# Patient Record
Sex: Male | Born: 1970 | Race: White | Hispanic: No | Marital: Married
Health system: Southern US, Community
[De-identification: ages and names within clinical notes are randomized; demographics above are authoritative.]

---

## 2004-09-23 ENCOUNTER — Emergency Department (HOSPITAL_COMMUNITY): Admission: EM | Admit: 2004-09-23 | Discharge: 2004-09-23 | Payer: Self-pay | Admitting: Family Medicine

## 2004-12-10 ENCOUNTER — Emergency Department (HOSPITAL_COMMUNITY): Admission: EM | Admit: 2004-12-10 | Discharge: 2004-12-10 | Payer: Self-pay | Admitting: Family Medicine

## 2005-04-08 ENCOUNTER — Emergency Department (HOSPITAL_COMMUNITY): Admission: EM | Admit: 2005-04-08 | Discharge: 2005-04-08 | Payer: Self-pay | Admitting: Emergency Medicine

## 2010-01-18 ENCOUNTER — Emergency Department (HOSPITAL_COMMUNITY): Admission: EM | Admit: 2010-01-18 | Discharge: 2010-01-18 | Payer: Self-pay | Admitting: Family Medicine

## 2014-02-22 ENCOUNTER — Ambulatory Visit
Admission: RE | Admit: 2014-02-22 | Discharge: 2014-02-22 | Disposition: A | Discharge status: Home / Self Care | Payer: BC Managed Care – PPO | Source: Ambulatory Visit | Attending: Family Medicine | Admitting: Family Medicine

## 2014-02-22 ENCOUNTER — Other Ambulatory Visit: Payer: Self-pay | Admitting: Family Medicine

## 2014-02-22 DIAGNOSIS — M545 Low back pain, unspecified: Secondary | ICD-10-CM

## 2015-07-23 IMAGING — CR DG SACRUM/COCCYX 2+V
3 series · 3 of 3 positions shown · non-contrast
Comparison: None.

CLINICAL DATA: Fell on ice with low back and coccygeal pain

EXAM:
SACRUM AND COCCYX - 2+ VIEW

[view not recorded (1 of 3)]
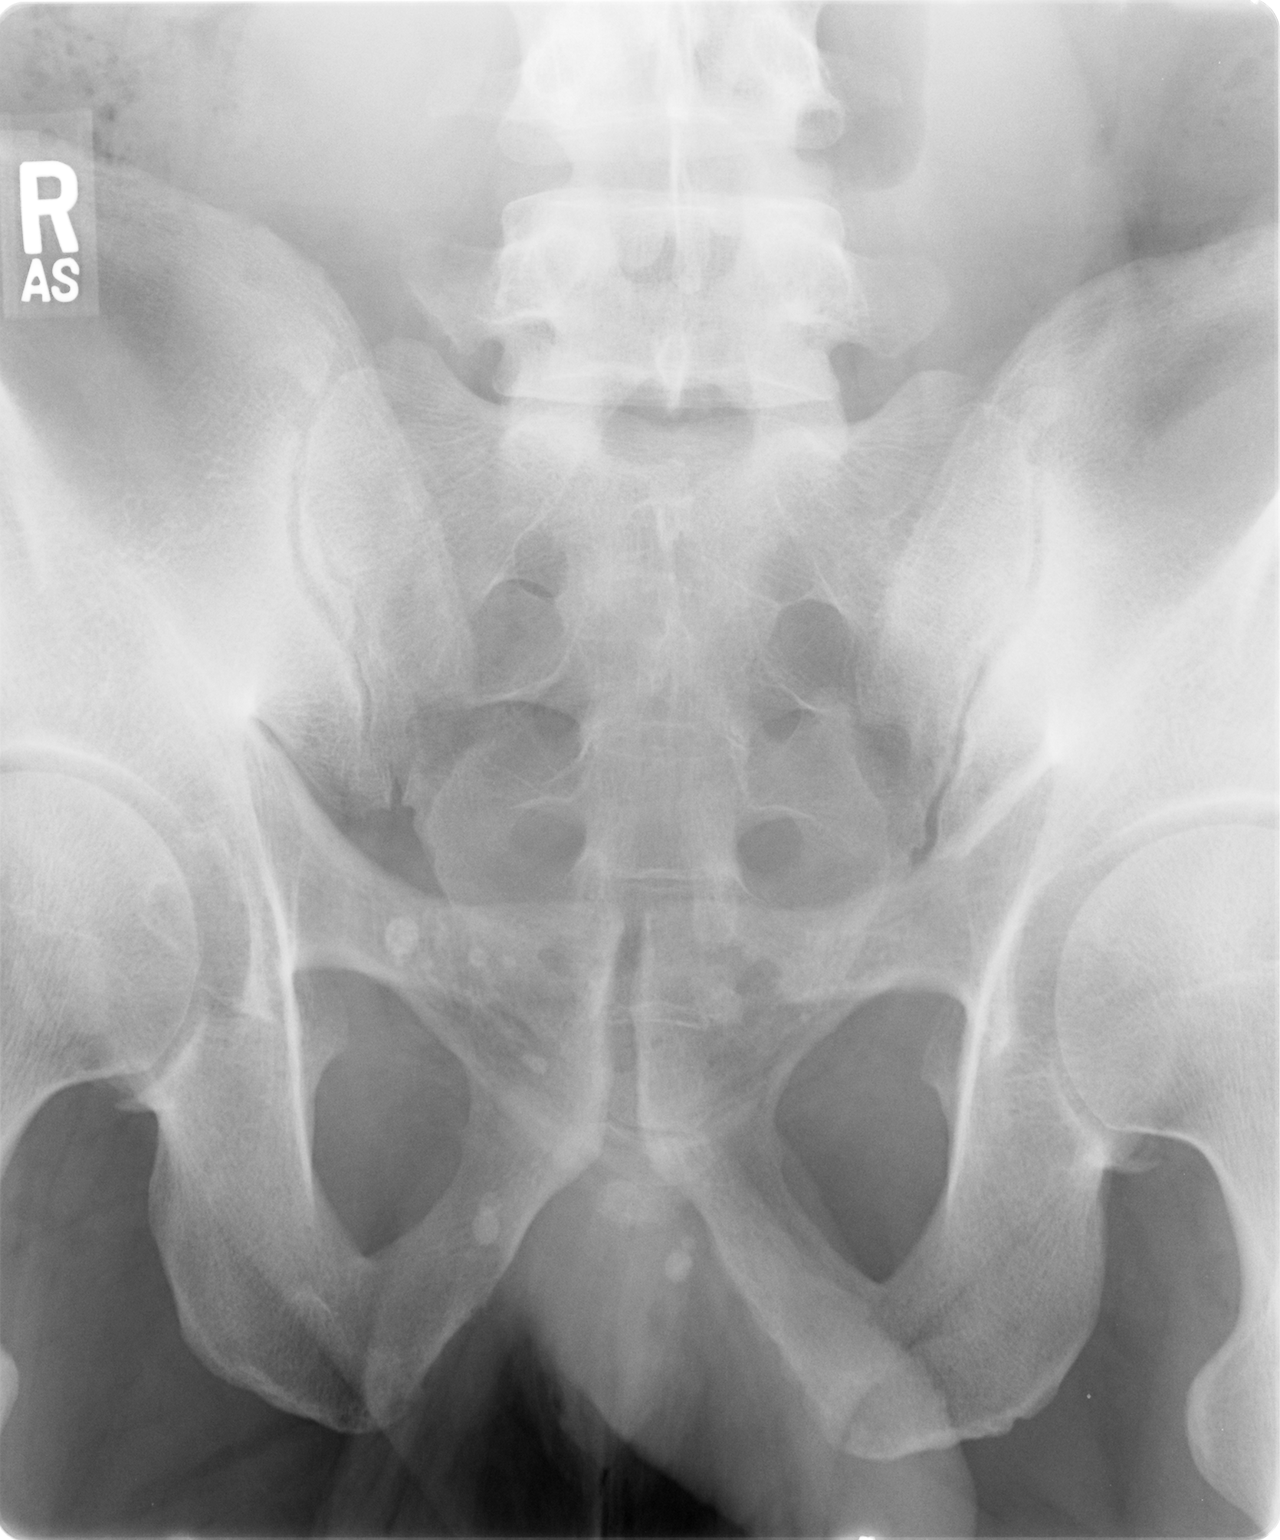

[view not recorded (2 of 3)]
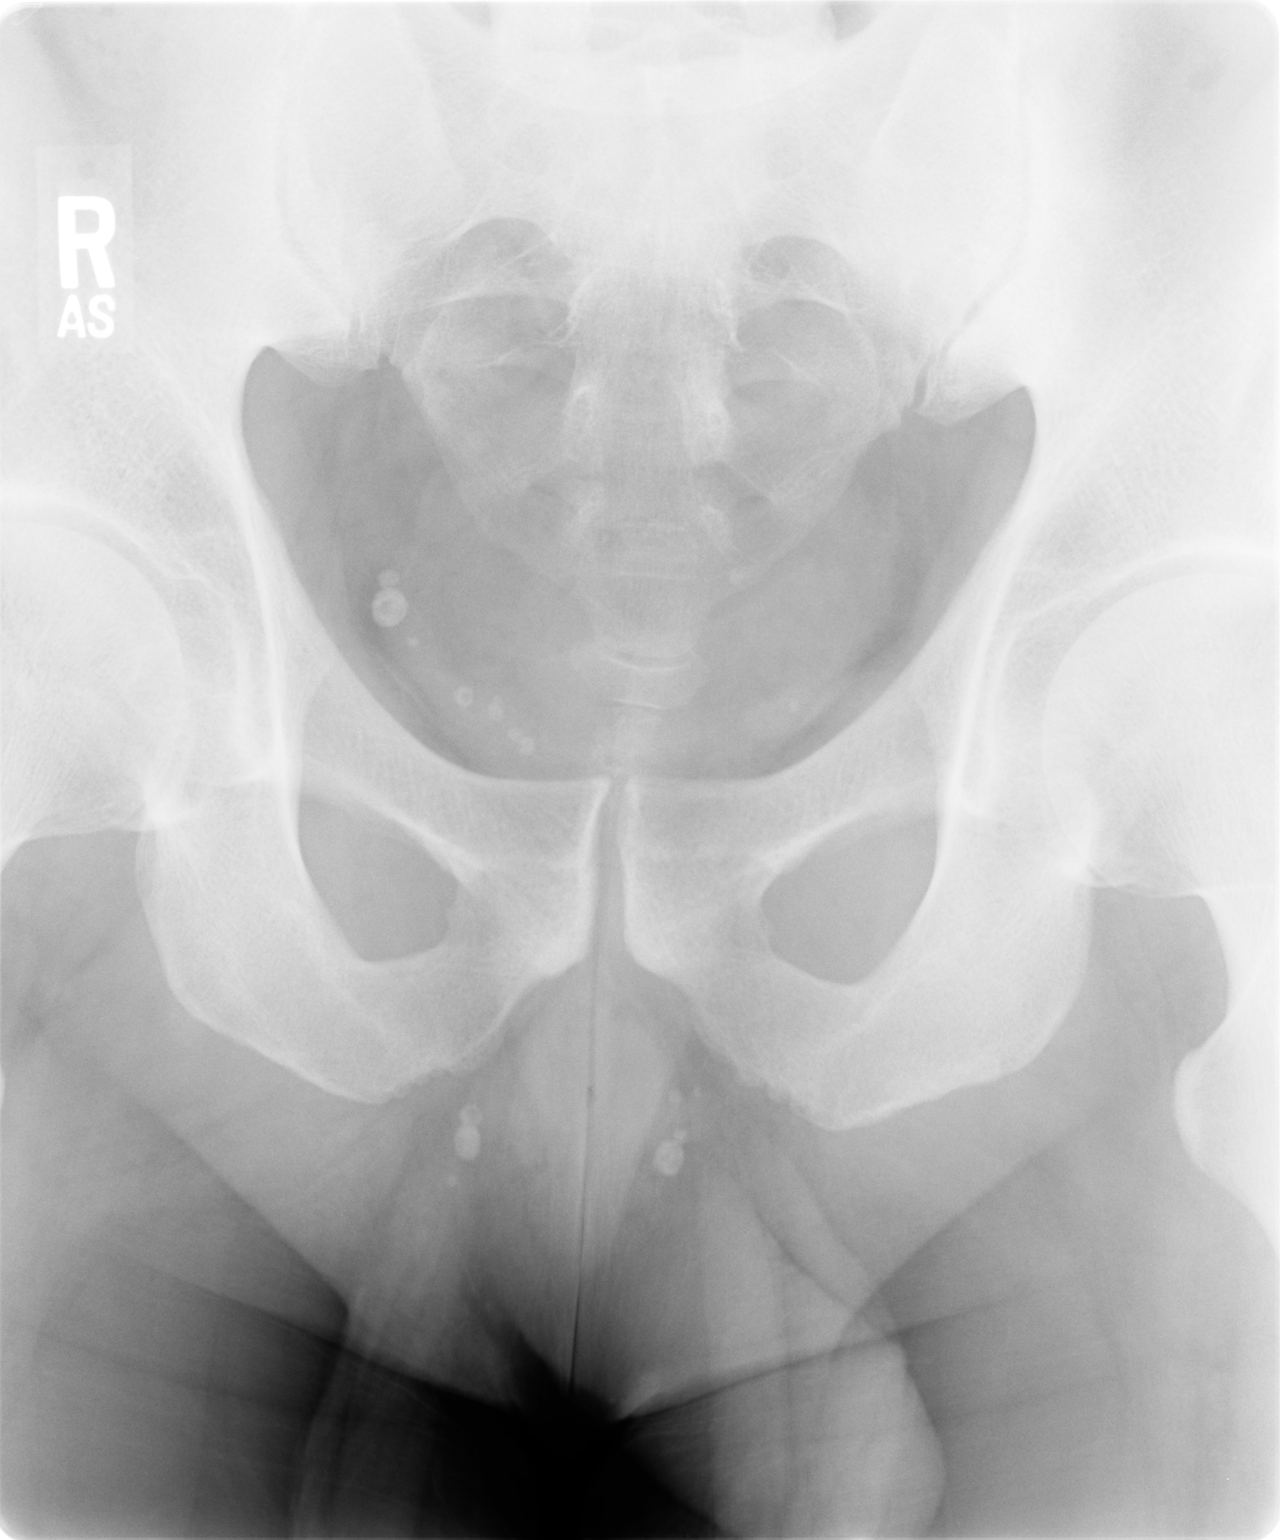

[view not recorded (3 of 3)]
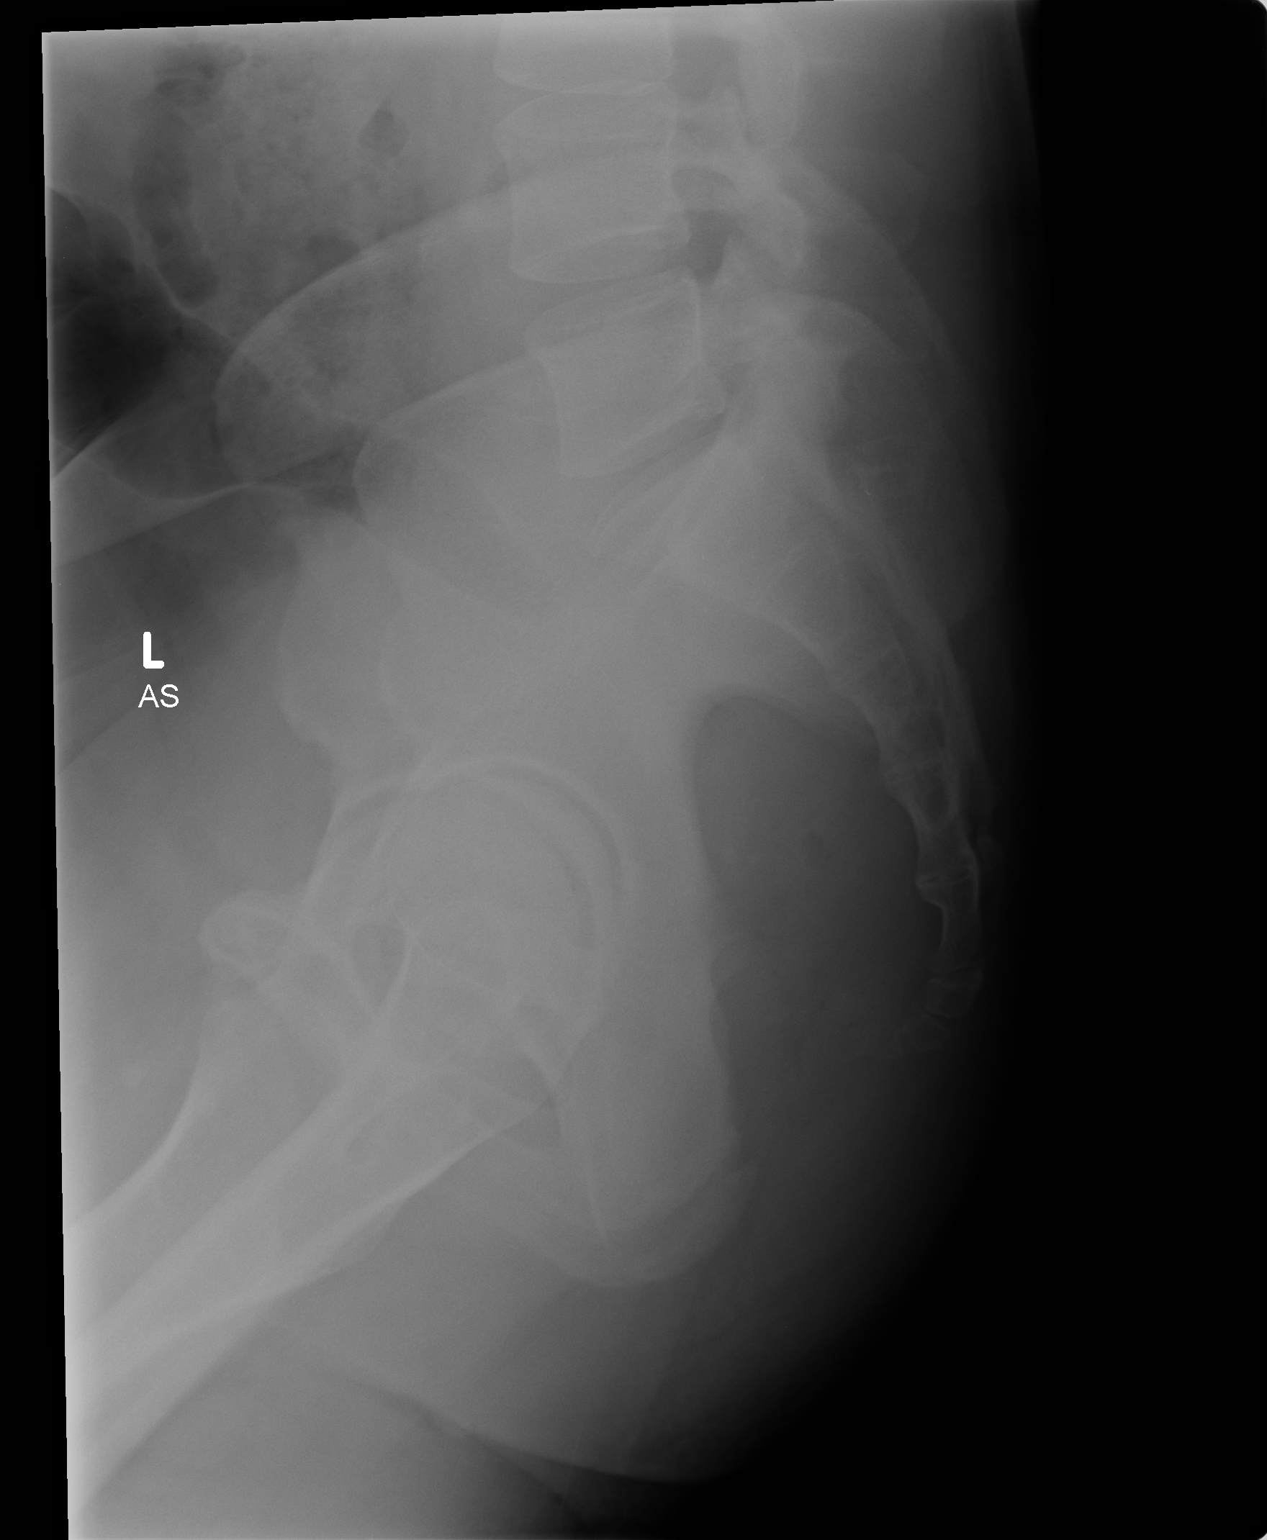

[3 of 3 positions shown; findings below may reference images not displayed]

FINDINGS: The SI joints appear normally corticated. The sacral foramina are
intact. No sacral or coccygeal fracture is seen. The sacrococcygeal
elements are in normal alignment. The pelvic rami are intact.
IMPRESSION: No acute fracture.

## 2016-12-09 DIAGNOSIS — J209 Acute bronchitis, unspecified: Secondary | ICD-10-CM | POA: Diagnosis not present

## 2016-12-09 DIAGNOSIS — R05 Cough: Secondary | ICD-10-CM | POA: Diagnosis not present

## 2017-02-04 DIAGNOSIS — Z79899 Other long term (current) drug therapy: Secondary | ICD-10-CM | POA: Diagnosis not present

## 2017-02-04 DIAGNOSIS — E782 Mixed hyperlipidemia: Secondary | ICD-10-CM | POA: Diagnosis not present

## 2017-02-04 DIAGNOSIS — Z0000 Encounter for general adult medical examination without abnormal findings: Secondary | ICD-10-CM | POA: Diagnosis not present

## 2018-02-10 DIAGNOSIS — E782 Mixed hyperlipidemia: Secondary | ICD-10-CM | POA: Diagnosis not present

## 2018-02-10 DIAGNOSIS — Z125 Encounter for screening for malignant neoplasm of prostate: Secondary | ICD-10-CM | POA: Diagnosis not present

## 2018-02-10 DIAGNOSIS — Z79899 Other long term (current) drug therapy: Secondary | ICD-10-CM | POA: Diagnosis not present

## 2018-02-10 DIAGNOSIS — Z0000 Encounter for general adult medical examination without abnormal findings: Secondary | ICD-10-CM | POA: Diagnosis not present

## 2019-02-12 DIAGNOSIS — Z79899 Other long term (current) drug therapy: Secondary | ICD-10-CM | POA: Diagnosis not present

## 2019-02-12 DIAGNOSIS — E782 Mixed hyperlipidemia: Secondary | ICD-10-CM | POA: Diagnosis not present

## 2019-02-12 DIAGNOSIS — Z0000 Encounter for general adult medical examination without abnormal findings: Secondary | ICD-10-CM | POA: Diagnosis not present

## 2020-02-21 DIAGNOSIS — E782 Mixed hyperlipidemia: Secondary | ICD-10-CM | POA: Diagnosis not present

## 2020-02-21 DIAGNOSIS — Z0000 Encounter for general adult medical examination without abnormal findings: Secondary | ICD-10-CM | POA: Diagnosis not present

## 2020-02-21 DIAGNOSIS — Z79899 Other long term (current) drug therapy: Secondary | ICD-10-CM | POA: Diagnosis not present

## 2021-03-17 DIAGNOSIS — Z0000 Encounter for general adult medical examination without abnormal findings: Secondary | ICD-10-CM | POA: Diagnosis not present

## 2021-03-17 DIAGNOSIS — Z79899 Other long term (current) drug therapy: Secondary | ICD-10-CM | POA: Diagnosis not present

## 2021-03-17 DIAGNOSIS — E782 Mixed hyperlipidemia: Secondary | ICD-10-CM | POA: Diagnosis not present

## 2022-03-18 DIAGNOSIS — E782 Mixed hyperlipidemia: Secondary | ICD-10-CM | POA: Diagnosis not present

## 2022-03-18 DIAGNOSIS — Z79899 Other long term (current) drug therapy: Secondary | ICD-10-CM | POA: Diagnosis not present

## 2022-03-18 DIAGNOSIS — Z0000 Encounter for general adult medical examination without abnormal findings: Secondary | ICD-10-CM | POA: Diagnosis not present

## 2022-03-18 DIAGNOSIS — Z125 Encounter for screening for malignant neoplasm of prostate: Secondary | ICD-10-CM | POA: Diagnosis not present

## 2022-04-05 DIAGNOSIS — E876 Hypokalemia: Secondary | ICD-10-CM | POA: Diagnosis not present

## 2023-03-23 DIAGNOSIS — Z5181 Encounter for therapeutic drug level monitoring: Secondary | ICD-10-CM | POA: Diagnosis not present

## 2023-03-23 DIAGNOSIS — Z79899 Other long term (current) drug therapy: Secondary | ICD-10-CM | POA: Diagnosis not present

## 2023-03-23 DIAGNOSIS — E782 Mixed hyperlipidemia: Secondary | ICD-10-CM | POA: Diagnosis not present

## 2023-03-23 DIAGNOSIS — Z0000 Encounter for general adult medical examination without abnormal findings: Secondary | ICD-10-CM | POA: Diagnosis not present

## 2023-05-25 DIAGNOSIS — J309 Allergic rhinitis, unspecified: Secondary | ICD-10-CM | POA: Diagnosis not present

## 2023-05-25 DIAGNOSIS — R030 Elevated blood-pressure reading, without diagnosis of hypertension: Secondary | ICD-10-CM | POA: Diagnosis not present

## 2024-02-23 DIAGNOSIS — J45909 Unspecified asthma, uncomplicated: Secondary | ICD-10-CM | POA: Diagnosis not present

## 2024-02-23 DIAGNOSIS — J988 Other specified respiratory disorders: Secondary | ICD-10-CM | POA: Diagnosis not present

## 2024-03-23 DIAGNOSIS — Z0000 Encounter for general adult medical examination without abnormal findings: Secondary | ICD-10-CM | POA: Diagnosis not present

## 2024-03-23 DIAGNOSIS — R053 Chronic cough: Secondary | ICD-10-CM | POA: Diagnosis not present

## 2024-03-23 DIAGNOSIS — Z131 Encounter for screening for diabetes mellitus: Secondary | ICD-10-CM | POA: Diagnosis not present

## 2024-03-23 DIAGNOSIS — E782 Mixed hyperlipidemia: Secondary | ICD-10-CM | POA: Diagnosis not present

## 2024-03-23 DIAGNOSIS — Z125 Encounter for screening for malignant neoplasm of prostate: Secondary | ICD-10-CM | POA: Diagnosis not present

## 2025-01-24 ENCOUNTER — Ambulatory Visit

## 2025-01-24 ENCOUNTER — Ambulatory Visit: Payer: Self-pay

## 2025-01-24 VITALS — BP 130/76 | HR 99 | Temp 97.5°F | Ht 71.0 in | Wt 220.0 lb

## 2025-01-24 DIAGNOSIS — Z9109 Other allergy status, other than to drugs and biological substances: Secondary | ICD-10-CM

## 2025-01-24 DIAGNOSIS — J4551 Severe persistent asthma with (acute) exacerbation: Secondary | ICD-10-CM

## 2025-01-24 DIAGNOSIS — Z87891 Personal history of nicotine dependence: Secondary | ICD-10-CM | POA: Diagnosis not present

## 2025-01-24 LAB — CBC WITH DIFFERENTIAL/PLATELET
Basophils Absolute: 0.1 10*3/uL (ref 0.0–0.1)
Basophils Relative: 0.6 % (ref 0.0–3.0)
Eosinophils Absolute: 3.7 10*3/uL — ABNORMAL HIGH (ref 0.0–0.7)
Eosinophils Relative: 39.1 % — ABNORMAL HIGH (ref 0.0–5.0)
HCT: 46.4 % (ref 39.0–52.0)
Hemoglobin: 15.9 g/dL (ref 13.0–17.0)
Lymphocytes Relative: 17.4 % (ref 12.0–46.0)
Lymphs Abs: 1.6 10*3/uL (ref 0.7–4.0)
MCHC: 34.3 g/dL (ref 30.0–36.0)
MCV: 94.7 fl (ref 78.0–100.0)
Monocytes Absolute: 0.6 10*3/uL (ref 0.1–1.0)
Monocytes Relative: 6.2 % (ref 3.0–12.0)
Neutro Abs: 3.5 10*3/uL (ref 1.4–7.7)
Neutrophils Relative %: 36.7 % — ABNORMAL LOW (ref 43.0–77.0)
Platelets: 204 10*3/uL (ref 150.0–400.0)
RBC: 4.9 Mil/uL (ref 4.22–5.81)
RDW: 13.4 % (ref 11.5–15.5)
WBC: 9.4 10*3/uL (ref 4.0–10.5)

## 2025-01-24 MED ORDER — LORATADINE 10 MG PO TABS: 10.0000 mg | ORAL_TABLET | Freq: Every day | ORAL | 11 refills | Status: AC

## 2025-01-24 MED ORDER — MONTELUKAST SODIUM 10 MG PO TABS: 10.0000 mg | ORAL_TABLET | Freq: Every day | ORAL | 11 refills | Status: AC

## 2025-01-24 MED ORDER — IPRATROPIUM-ALBUTEROL 0.5-2.5 (3) MG/3ML IN SOLN: 3.0000 mL | Freq: Four times a day (QID) | RESPIRATORY_TRACT | 1 refills | Status: AC | PRN

## 2025-01-24 MED ORDER — PREDNISONE 10 MG PO TABS: ORAL_TABLET | ORAL | 0 refills | Status: AC

## 2025-01-24 MED ORDER — AZITHROMYCIN 250 MG PO TABS: ORAL_TABLET | ORAL | 0 refills | Status: AC

## 2025-01-24 MED ORDER — METHYLPREDNISOLONE ACETATE 80 MG/ML IJ SUSP
80.0000 mg | Freq: Once | INTRAMUSCULAR | Status: AC
  Administered 2025-01-24: 80 mg via INTRAMUSCULAR

## 2025-01-24 MED ORDER — FLUTICASONE-SALMETEROL 500-50 MCG/ACT IN AEPB: 1.0000 | INHALATION_SPRAY | Freq: Two times a day (BID) | RESPIRATORY_TRACT | 5 refills | Status: AC

## 2025-01-24 NOTE — Progress Notes (Signed)
 "  New Patient Pulmonology Office Visit   Subjective:  Patient ID: Logan Bennett, male    DOB: January 30, 1971  MRN: [id number]  Referred by: Auston Opal, DO  CC:  Chief Complaint  Patient presents with   Consult    Suspected asthma- pt states it started when he had an abscess on his tooth. Pt had tooth removed and does not think he has asthma     HPI Logan Bennett is a 54 y.o. male who is here to establish for sever asthma  Discussed the use of AI scribe software for clinical note transcription with the patient, who gave verbal consent to proceed.  History of Present Illness Logan Bennett is a 54 year old male with asthma who presents with worsening respiratory symptoms.  He has a history of asthma and seasonal allergies, with symptoms exacerbated by environmental factors such as mowing the yard. He experiences chest tightness, initially managed with Wixela, prescribed last year for seasonal allergies.  Over the past six weeks, he has experienced worsening respiratory symptoms. He uses albuterol  several times a day, with four consecutive uses last night, and has borrowed a nebulizer from friends over the past two nights. He recalls a previous episode where doxycycline was prescribed, which resolved his symptoms for about six weeks before they gradually returned.  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 He has a history of a dental abscess that required longer-term antibiotics and direct antibiotic injections due to the infection spreading to his sinus cavity, causing sinus problems. The infection has since cleared, and he now has dental prosthetics.  Family history includes asthma and allergies, with his grandfather having had asthma, emphysema, and lung cancer attributed to Agent Orange exposure. He quit smoking in the early 2000s.  He currently takes Claritin  intermittently and has used Alka Seltzer  for cough and congestion, but not consistently. He has difficulty managing his symptoms and the impact on his work, requiring medical notes for his job. No recent exposure to sick contacts at home.     ROS Positive for shortness of breath, wheezing, cough Review of symptoms negative except mentioned above  Allergies: Patient has no allergy information on record. Current Medications[1] History reviewed. No pertinent past medical history. History reviewed. No pertinent surgical history. History reviewed. No pertinent family history. Social History   Socioeconomic History   Marital status: Married    Spouse name: Not on file   Number of children: Not on file   Years of education: Not on file   Highest education level: Not on file  Occupational History   Not on file  Tobacco Use   Smoking status: Former    Current packs/day: 0.00    Types: Cigarettes    Quit date: 2002    Years since quitting: 24.0    Passive exposure: Past   Smokeless tobacco: Never  Substance and Sexual Activity   Alcohol use: Not on file   Drug use: Not on file   Sexual activity: Not on file  Other Topics Concern   Not on file  Social History Narrative   Not on file   Social Drivers of Health   Tobacco Use: Medium Risk (01/24/2025)   Patient History    Smoking Tobacco Use: Former    Smokeless Tobacco Use: Never    Passive Exposure: Past  Physicist, Medical Strain: Not on file  Food Insecurity: Not  on file  Transportation Needs: Not on file  Physical Activity: Not on file  Stress: Not on file  Social Connections: Not on file  Intimate Partner Violence: Not on file  Depression (PHQ2-9): Not on file  Alcohol Screen: Not on file  Housing: Not on file  Utilities: Not on file  Health Literacy: Not on file         Objective:  BP 130/76   Pulse 99   Temp (!) 97.5 F (36.4 C)   Ht 5' 11 (1.803 m) Comment: pt stated  Wt 220 lb (99.8 kg)   SpO2 94% Comment: ra  BMI 30.68 kg/m     Physical Exam Constitutional:      General: He is not in acute distress.    Appearance: Normal appearance.  HENT:     Mouth/Throat:     Mouth: Mucous membranes are moist.  Cardiovascular:     Rate and Rhythm: Normal rate.  Pulmonary:     Effort: No respiratory distress.     Breath sounds: Wheezing and rales present.  Musculoskeletal:     Right lower leg: No edema.     Left lower leg: No edema.  Skin:    General: Skin is warm.  Neurological:     Mental Status: He is alert and oriented to person, place, and time.  Psychiatric:        Mood and Affect: Mood normal.     Diagnostic Review:    Pft     No data to display               Results       Assessment & Plan:   Assessment & Plan Severe persistent asthma with (acute) exacerbation (HCC) Discussed the symptoms, etiology, pathophysiology, diagnostic test, treatment, flare ups,  prognosis of asthma Pt is currently in the midst of a flare up- worsening symptoms over the last 4-6 weeks Gave steroid shot, will get chest xray and labs If symptoms get worse pt will report to ED/urgent care Will treat with a course of steroids and antibiotics Nebulizer prescribed- pt to use duonebs and albuterol  q  4 hr as needed He will change his maintenance inhaler to higher strength wixela Orders:   methylPREDNISolone  acetate (DEPO-MEDROL ) injection 80 mg   DG Chest 2 View; Future   fluticasone -salmeterol (WIXELA INHUB) 500-50 MCG/ACT AEPB; Inhale 1 puff into the lungs in the morning and at bedtime.   ipratropium-albuterol  (DUONEB) 0.5-2.5 (3) MG/3ML SOLN; Take 3 mLs by nebulization every 6 (six) hours as needed.   montelukast  (SINGULAIR ) 10 MG tablet; Take 1 tablet (10 mg total) by mouth at bedtime.   For home use only DME Nebulizer machine   predniSONE  (DELTASONE ) 10 MG tablet; Take 4 tab a day for 3 days, 3 tab a day for 3 days, 2 tab a day for 2 days and 1 tab a day for 2 days and stop   azithromycin  (ZITHROMAX  Z-PAK)  250 MG tablet; Take 2 tablets on day 1, take 1 tab on day 2,3,4 and 5 and stop   Pulmonary function test; Future  Environmental allergies Encouraged compliance with allergy medications  Pt will take OTC antihistaminics and singulair  I discussed FDA black box warning on singulair  including but not limited to risk of serious neuropsychiatric events like depression, anxiety, suicidal thoughts, hallucinations, memory problems. Patient is aware and is willing to try. Advised to alert us  and to stop singulair   if those Adverse effects are noticed.  Orders:  RESPIRATORY ALLERGY PANEL REGION II W/ RFLX: [place]; Future   IgE; Future   CBC with Differential/Platelet; Future   DG Chest 2 View; Future   montelukast  (SINGULAIR ) 10 MG tablet; Take 1 tablet (10 mg total) by mouth at bedtime.   loratadine  (CLARITIN ) 10 MG tablet; Take 1 tablet (10 mg total) by mouth daily.   Pulmonary function test; Future   Letter for work provided    Thank you for the opportunity to take part in the care of Logan Bennett   Return in about 3 weeks (around 02/14/2025).   Obdulio Mash Pleas, MD [facility] Office: [phone]    [1]  Current Outpatient Medications:    albuterol  (VENTOLIN  HFA) 108 (90 Base) MCG/ACT inhaler, 2 puff as needed Inhalation every 4 hrs, Disp: , Rfl:    atorvastatin (LIPITOR) 10 MG tablet, 1 tablet by mouth Once a day for cholesterol, Disp: , Rfl:    azithromycin  (ZITHROMAX  Z-PAK) 250 MG tablet, Take 2 tablets on day 1, take 1 tab on day 2,3,4 and 5 and stop, Disp: 6 tablet, Rfl: 0   fluticasone -salmeterol (WIXELA INHUB) 500-50 MCG/ACT AEPB, Inhale 1 puff into the lungs in the morning and at bedtime., Disp: 1 each, Rfl: 5   ipratropium-albuterol  (DUONEB) 0.5-2.5 (3) MG/3ML SOLN, Take 3 mLs by nebulization every 6 (six) hours as needed., Disp: 360 mL, Rfl: 1   loratadine  (CLARITIN ) 10 MG tablet, Take 1 tablet (10 mg total) by mouth daily., Disp: 30 tablet, Rfl:  11   meloxicam (MOBIC) 15 MG tablet, 1 tablet Orally Once a day for pain and inflammation As needed, Disp: , Rfl:    montelukast  (SINGULAIR ) 10 MG tablet, Take 1 tablet (10 mg total) by mouth at bedtime., Disp: 30 tablet, Rfl: 11   Omega-3 Fatty Acids (FISH OIL) 1200 MG CAPS, 1 capsule., Disp: , Rfl:    predniSONE  (DELTASONE ) 10 MG tablet, Take 4 tab a day for 3 days, 3 tab a day for 3 days, 2 tab a day for 2 days and 1 tab a day for 2 days and stop, Disp: 27 tablet, Rfl: 0  "

## 2025-01-24 NOTE — Patient Instructions (Signed)
" °  VISIT SUMMARY: During your visit, we addressed your worsening respiratory symptoms related to severe persistent asthma and environmental allergies. We discussed your current treatment plan and made adjustments to better manage your symptoms.  YOUR PLAN: SEVERE PERSISTENT ASTHMA: Your asthma symptoms have worsened over the past six weeks, with increased wheezing and difficulty breathing. -You received a steroid injection today. -You were prescribed oral steroids to take at home. -You were prescribed antibiotics. -A chest x-ray and lab tests were ordered. -You were prescribed a nebulizer with albuterol  solution for home use. -Continue using Wixela until a new maintenance inhaler is prescribed. -We will schedule a follow-up appointment in three weeks for a breathing test and to adjust your medication.  ENVIRONMENTAL ALLERGIES: Your allergies are contributing to your sinus issues and airway reactivity. -You were prescribed Singulair  for long-term allergy management. -You should take Claritin  or a similar antihistamine daily.    Contains text generated by [employer].   "

## 2025-01-28 ENCOUNTER — Telehealth (HOSPITAL_BASED_OUTPATIENT_CLINIC_OR_DEPARTMENT_OTHER): Payer: Self-pay

## 2025-01-28 LAB — DOG DANDER COMPONENT
Can f 4(e229) IgE: 0.19 kU/L — ABNORMAL HIGH
E101-IgE Can f 1: <0.1 kU/L
E102-IgE Can f 2: <0.1 kU/L
E221-IgE Can f 3: <0.1 kU/L
E226-IgE Can f 5: <0.1 kU/L
E230-IGE CAN F 6: <0.1 kU/L

## 2025-01-28 LAB — RESPIRATORY ALLERGY PANEL REGION II W/ RFLX: ~~LOC~~
Allergen, A. alternata, m6: <0.1 kU/L
Allergen, Cedar tree, t12: <0.1 kU/L
Allergen, Comm Silver Birch, t9: <0.1 kU/L
Allergen, Cottonwood, t14: <0.1 kU/L
Allergen, D pternoyssinus,d7: 0.28 kU/L — ABNORMAL HIGH
Allergen, Mouse Urine Protein, e78: <0.1 kU/L
Allergen, Mulberry, t76: <0.1 kU/L
Allergen, Oak,t7: <0.1 kU/L
Allergen, P. notatum, m1: <0.1 kU/L
Aspergillus fumigatus, m3: 0.7 kU/L — ABNORMAL HIGH
Bermuda Grass: <0.1 kU/L
Box Elder IgE: 0.26 kU/L — ABNORMAL HIGH
CLADOSPORIUM HERBARUM (M2) IGE: <0.1 kU/L
COMMON RAGWEED (SHORT) (W1) IGE: <0.1 kU/L
Cat Dander: <0.1 kU/L
Class: 0
Class: 0
Class: 0
Class: 0
Class: 0
Class: 0
Class: 0
Class: 0
Class: 0
Class: 0
Class: 0
Class: 0
Class: 0
Class: 0
Class: 0
Class: 0
Class: 0
Class: 2
Cockroach: 0.23 kU/L — ABNORMAL HIGH
D. farinae: 0.15 kU/L — ABNORMAL HIGH
Dog Dander: 0.16 kU/L — ABNORMAL HIGH
Elm IgE: <0.1 kU/L
IgE (Immunoglobulin E), Serum: 882 kU/L — ABNORMAL HIGH
Johnson Grass: <0.1 kU/L
Pecan/Hickory Tree IgE: <0.1 kU/L
Rough Pigweed  IgE: <0.1 kU/L
Sheep Sorrel IgE: <0.1 kU/L
Timothy Grass: 0.26 kU/L — ABNORMAL HIGH

## 2025-01-28 LAB — INTERPRETATION:

## 2025-01-29 ENCOUNTER — Encounter (HOSPITAL_BASED_OUTPATIENT_CLINIC_OR_DEPARTMENT_OTHER): Payer: Self-pay

## 2025-01-29 ENCOUNTER — Ambulatory Visit: Payer: Self-pay

## 2025-01-29 NOTE — Telephone Encounter (Signed)
 Note sent to Mychart

## 2025-02-18 ENCOUNTER — Ambulatory Visit
